# Patient Record
Sex: Female | Born: 2000 | Race: White | Hispanic: No | Marital: Single | Smoking: Never smoker
Health system: Southern US, Community
[De-identification: ages and names within clinical notes are randomized; demographics above are authoritative.]

---
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
## 2022-03-16 IMAGING — CR DG ABDOMEN ACUTE W/ 1V CHEST
3 series · 3 of 3 positions shown · non-contrast
Comparison: None.

CLINICAL DATA: LEFT upper abdominal pain

EXAM:
DG ABDOMEN ACUTE WITH 1 VIEW CHEST

[w chest pa]
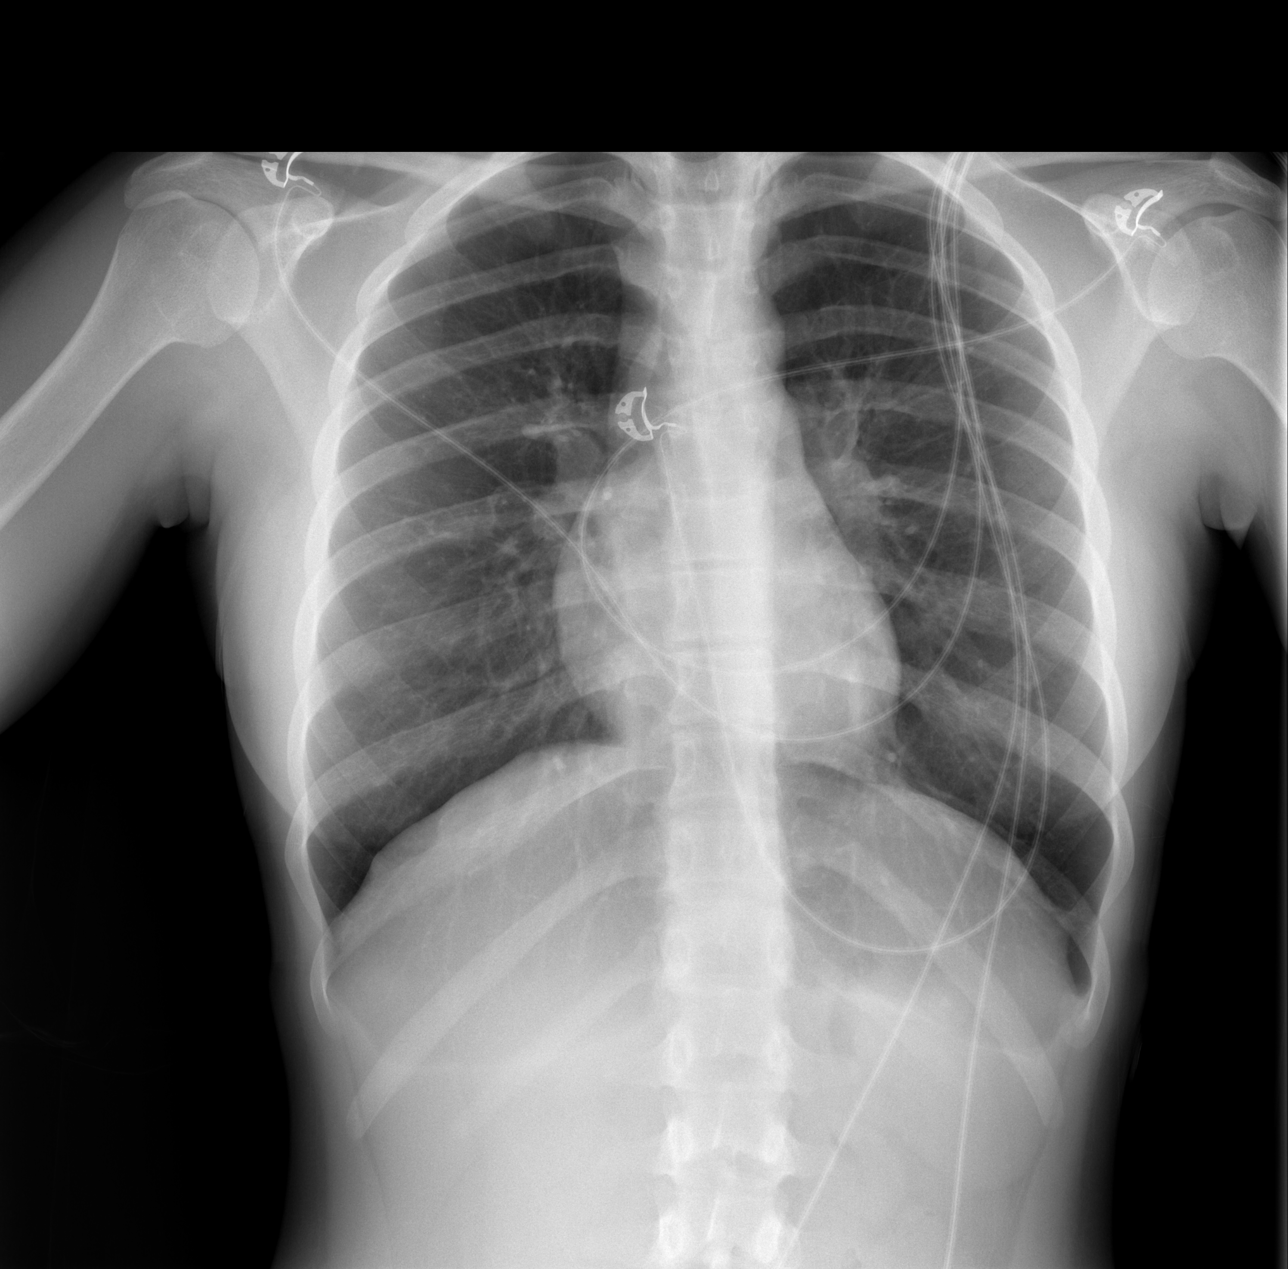

[w abdomen upright]
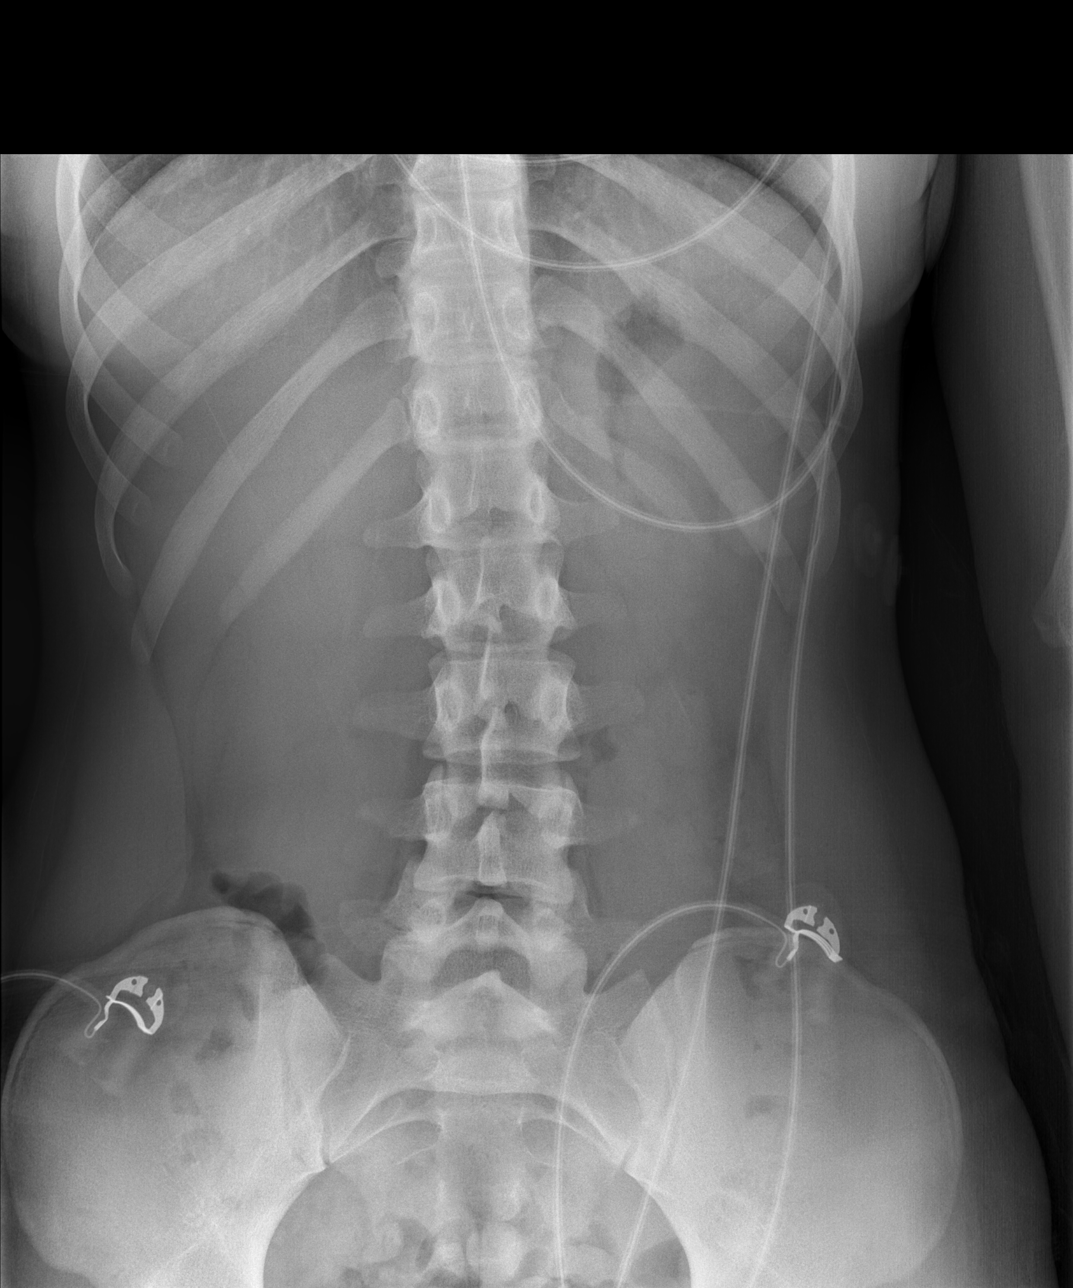

[t abdomen supine]
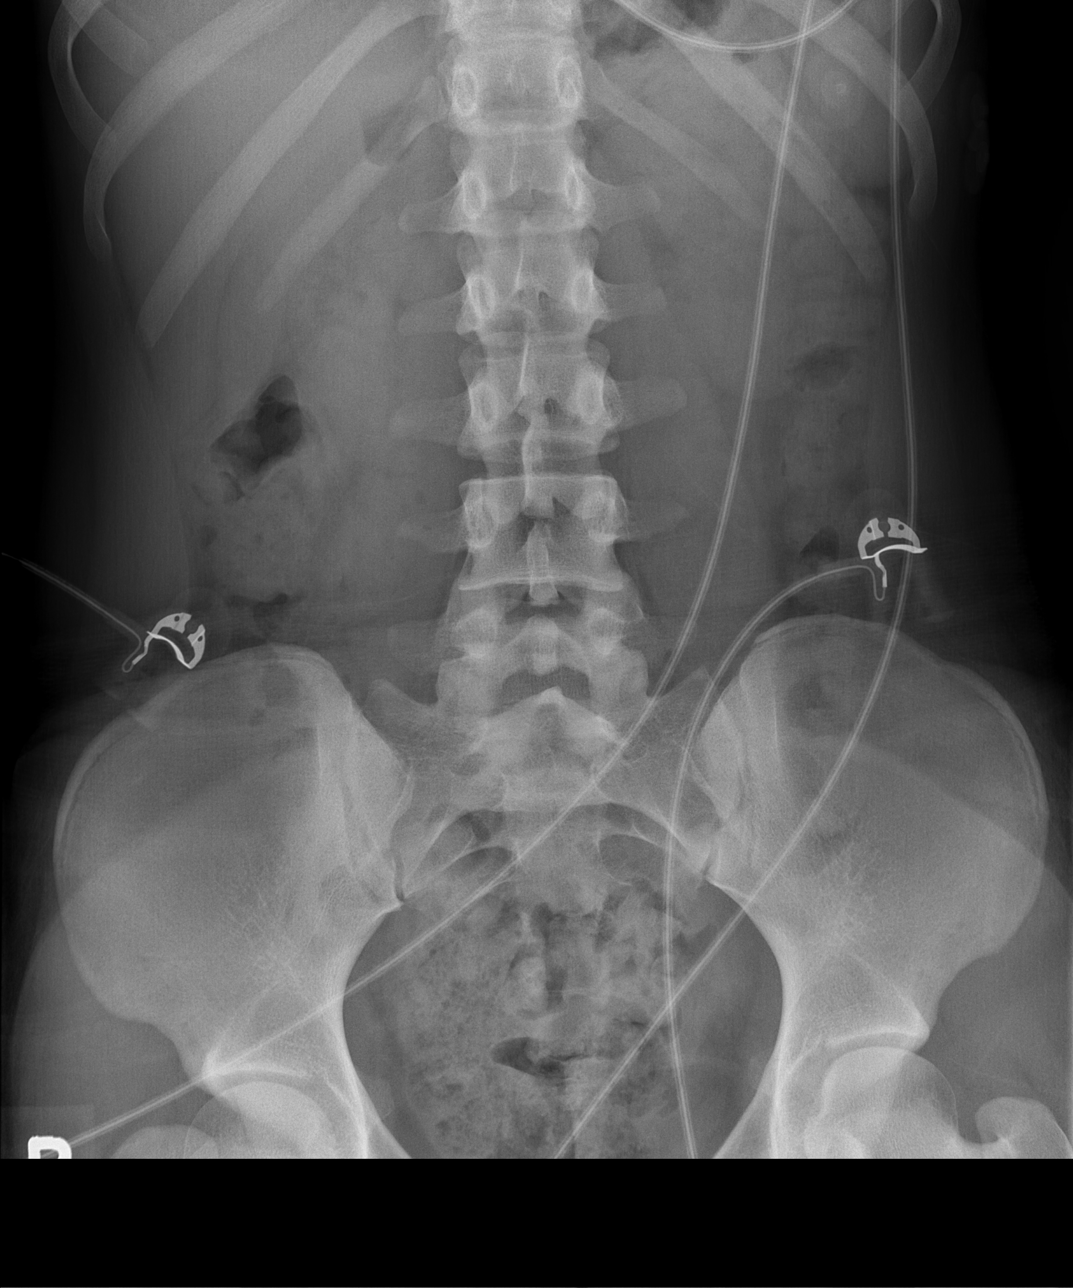

[3 of 3 positions shown; findings below may reference images not displayed]

FINDINGS: The cardiomediastinal silhouette is normal in contour. No pleural
effusion. No pneumothorax. No acute pleuroparenchymal abnormality.

Nonobstructive bowel gas pattern. Moderate colonic stool burden
diffusely throughout the colon, most predominately within the
rectum. No free air. No radiopaque densities projecting over the
contours of the kidneys. No acute osseous abnormality noted.
IMPRESSION: 1. Nonobstructive bowel gas pattern. No free air. Moderate colonic
stool burden diffusely throughout the colon, most predominately
within the rectum.

2.  Clear lungs.

## 2022-04-28 ENCOUNTER — Ambulatory Visit (HOSPITAL_COMMUNITY)
Admission: AD | Admit: 2022-04-28 | Discharge: 2022-04-28 | Disposition: A | Discharge status: Home / Self Care | Payer: Medicaid Other | Source: Intra-hospital | Attending: Psychiatry | Admitting: Psychiatry

## 2022-04-28 DIAGNOSIS — F331 Major depressive disorder, recurrent, moderate: Secondary | ICD-10-CM

## 2022-04-28 DIAGNOSIS — R45851 Suicidal ideations: Secondary | ICD-10-CM

## 2022-04-28 DIAGNOSIS — F32A Depression, unspecified: Secondary | ICD-10-CM | POA: Diagnosis present

## 2022-04-28 NOTE — H&P (Signed)
Behavioral Health Medical Screening Exam  HPI: Ellen Ball is a 21 y.o. Caucasian female who presented voluntarily to [facility] worsening anxiety and depressive symptoms. 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 She is currently on Summer vacation and lives at home with her parents.   On assessment today, Patient is examined sitting up calmly in the conference room. Chart reviewed and findings shared with the tx team and discussed with the Dr. Lucianne Muss. A/O x 4. Speech clear and coherent and with normal pattern and volume. Mood/Affect appropriate, anxious and depressed. Thought process coherent and linear. Thought content  WNL. Memory, Judgement and Insight fair.  Reported anxiety / depression being triggered by school demands and trust issues with boyfriend.  Rated anxiety as 8/10 on a scale of 0 to 10, 10 being the highest. Endorsed family hx of mental illness. With father diagnosed with PTSD and anxiety and mother with alcoholism and depression.  Endorsed hx of self cutting behavior, whenever, she is stressed, with last cut on 01/2022. Endorsed being followed by a therapist and psychiatrist at [facility], which Pt states is inadequate. Endorsed being on Lexapro 20 mg po for depression since she was 21 years old.  Endorsed sleeping up to 10 to 13 hours last night and good appetite, due to depression Reported being safe at home, and  denied access to firearms  Patient denied SI, HI, AVH, or suicide attempt.   Disposition: Based on my evaluation of patient, there is no evidence of imminent risk to self or others at present and patient does not meet criteria for psychiatric inpatient admission and thus is psych cleared. Discussed crisis plan, support from social network, calling 911, coming to the Emergency Department, and calling Suicide Hotline. Patient was able to contract for safety and supportive community resources provided about ongoing stressors. Patient left [facility] without any  incident.  Total Time spent with patient: 1 hour  Psychiatric Specialty Exam:  Presentation  General Appearance: Appropriate for Environment; Casual; Fairly Groomed  Eye Contact:Good  Speech:Clear and Coherent; Normal Rate  Speech Volume:Normal  Handedness:Right  Mood and Affect  Mood:Anxious; Depressed  Affect:Congruent; Depressed; Tearful  Thought Process  Thought Processes:Coherent; Goal Directed; Linear  Descriptions of Associations:Intact  Orientation:Full (Time, Place and Person)  Thought Content:Logical; WDL  History of Schizophrenia/Schizoaffective disorder:No data recorded Duration of Psychotic Symptoms:No data recorded Hallucinations:Hallucinations: None  Ideas of Reference:None  Suicidal Thoughts:Suicidal Thoughts: No  Homicidal Thoughts:Homicidal Thoughts: No  Sensorium  Memory:Immediate Fair; Recent Fair; Remote Fair  Judgment:Fair  Insight:Good  Executive Functions  Concentration:Good  Attention Span:Good  Recall:Good  Fund of Knowledge:Good  Language:Good  Psychomotor Activity  Psychomotor Activity:Psychomotor Activity: Normal  Assets  Assets:Communication Skills; Desire for Improvement; Housing; Physical Health; Social Support; Vocational/Educational  Sleep  Sleep:Sleep: Good Number of Hours of Sleep: 10  Physical Exam: Physical Exam Vitals and nursing note reviewed.  Constitutional:      Appearance: Normal appearance.  HENT:     Head: Normocephalic and atraumatic.     Right Ear: External ear normal.     Left Ear: External ear normal.     Mouth/Throat:     Mouth: Mucous membranes are moist.     Pharynx: Oropharynx is clear.  Eyes:     Extraocular Movements: Extraocular movements intact.     Conjunctiva/sclera: Conjunctivae normal.     Pupils: Pupils are equal, round, and reactive to light.  Cardiovascular:     Rate and Rhythm: Normal rate.     Pulses: Normal  pulses.  Pulmonary:     Effort: Pulmonary effort is  normal.  Abdominal:     Palpations: Abdomen is soft.  Genitourinary:    Comments: Deferred Musculoskeletal:        General: Normal range of motion.     Cervical back: Normal range of motion and neck supple.  Skin:    General: Skin is warm.  Neurological:     General: No focal deficit present.     Mental Status: She is alert and oriented to person, place, and time.  Psychiatric:        Behavior: Behavior normal.   Review of Systems  Constitutional: Negative.  Negative for chills and fever.  HENT: Negative.  Negative for hearing loss and tinnitus.   Eyes: Negative.  Negative for blurred vision and double vision.  Respiratory: Negative.  Negative for cough, sputum production, shortness of breath and wheezing.   Cardiovascular: Negative.  Negative for chest pain and palpitations.  Gastrointestinal: Negative.  Negative for abdominal pain, constipation, diarrhea, heartburn, nausea and vomiting.  Genitourinary: Negative.  Negative for dysuria, frequency and urgency.  Musculoskeletal: Negative.  Negative for back pain, falls, joint pain, myalgias and neck pain.  Skin: Negative.  Negative for itching and rash.  Neurological: Negative.  Negative for dizziness, tingling, tremors, sensory change, speech change, focal weakness, seizures, loss of consciousness, weakness and headaches.  Endo/Heme/Allergies: Negative.  Negative for environmental allergies and polydipsia. Does not bruise/bleed easily.  Psychiatric/Behavioral:  Positive for depression and suicidal ideas. The patient is nervous/anxious.   There were no vitals taken for this visit. There is no height or weight on file to calculate BMI.  Musculoskeletal: Strength & Muscle Tone: within normal limits Gait & Station: normal Patient leans: N/A  Recommendations:  Based on my evaluation the patient does not appear to have an emergency medical condition.  Cecilie Lowers, FNP 04/28/2022, 8:02 PM
